# Patient Record
Sex: Female | Born: 2000 | Race: White | Hispanic: No | Marital: Single | Smoking: Never smoker
Health system: Southern US, Community
[De-identification: ages and names within clinical notes are randomized; demographics above are authoritative.]

## PROBLEM LIST (undated history)

## (undated) DIAGNOSIS — F32A Depression, unspecified: Secondary | ICD-10-CM

## (undated) HISTORY — DX: Depression, unspecified: F32.A

---
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
## 2021-12-12 ENCOUNTER — Ambulatory Visit
Admission: RE | Admit: 2021-12-12 | Discharge: 2021-12-12 | Disposition: A | Discharge status: Home / Self Care | Payer: BC Managed Care – PPO | Source: Ambulatory Visit | Attending: Urgent Care | Admitting: Urgent Care

## 2021-12-12 VITALS — BP 109/76 | HR 70 | Temp 98.9°F | Resp 15

## 2021-12-12 DIAGNOSIS — Z20822 Contact with and (suspected) exposure to covid-19: Secondary | ICD-10-CM | POA: Insufficient documentation

## 2021-12-12 DIAGNOSIS — J029 Acute pharyngitis, unspecified: Secondary | ICD-10-CM | POA: Insufficient documentation

## 2021-12-12 DIAGNOSIS — J019 Acute sinusitis, unspecified: Secondary | ICD-10-CM | POA: Insufficient documentation

## 2021-12-12 LAB — RESP PANEL BY RT-PCR (RSV, FLU A&B, COVID)  RVPGX2
Influenza A by PCR: NEGATIVE
Influenza B by PCR: NEGATIVE
Resp Syncytial Virus by PCR: POSITIVE — AB
SARS Coronavirus 2 by RT PCR: NEGATIVE

## 2021-12-12 LAB — POCT RAPID STREP A (OFFICE): Rapid Strep A Screen: NEGATIVE

## 2021-12-12 MED ORDER — METHYLPREDNISOLONE 4 MG PO TBPK: ORAL_TABLET | ORAL | 0 refills | Status: DC

## 2021-12-12 MED ORDER — PSEUDOEPHEDRINE HCL 60 MG PO TABS: 60.0000 mg | ORAL_TABLET | ORAL | 0 refills | Status: AC | PRN

## 2021-12-12 NOTE — Discharge Instructions (Addendum)
As discussed, start Medrol dose today as directed on packaging.  Continue until complete if tolerated without significant side effects.  Stop taking this medication if you develop significant anxiety/panic attacks.  I have also prescribed pseudoephedrine for congestion if you are unable to continue the steroid.     Follow up here or with your primary care provider if your symptoms are worsening or not improving with treatment.

## 2021-12-12 NOTE — ED Triage Notes (Signed)
Pt. States since Thursday she has been experiencing increasing fatigue, nasal congestion and a sore throat. Pt. Also mentions having an ocular migraine that was new for her  that affected her vision.

## 2021-12-12 NOTE — ED Provider Notes (Signed)
[facility]    CSN: [id number] Arrival date & time: 12/12/21  1116      History   Chief Complaint Chief Complaint  Patient presents with   Nasal Congestion    I have a cold that started on Friday, it has only gotten worse. I've been extremely fatigued, and had an ocular migraine, which I've never had before. I am also going to the mountains this weekend, and am scared of getting altitude sickness/worse. - Entered by patient   Sore Throat    HPI Mahathi Pokorney is a 21 y.o. female.    Sore Throat    Presents to urgent care with symptoms since last Thursday (6 days) she endorses fatigue, nasal congestion, sore throat.  Endorses "ocular migraine" and sinus pain, ears popping.  States the headache is affecting her vision.  Denies fever, myalgias, chills.  History reviewed. No pertinent past medical history.  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 No pertinent surgical history.  OB History   No obstetric history on file.      Home Medications    Prior to Admission medications   Medication Sig Start Date End Date Taking? Authorizing Provider  methylPREDNISolone (MEDROL DOSEPAK) 4 MG TBPK tablet Steroid taper. Take as directed by packaging. 12/12/21  Yes Arlander Gillen, Jeannett Senior, FNP  Misc Natural Products (JOINT SUPPORT COMPLEX PO) 10 mg by oral route. 01/26/18  Yes [provider]  norethindrone-ethinyl estradiol-iron (JUNEL FE 1.5/30) 1.5-30 MG-MCG tablet Take 1 tablet by mouth daily. 10/27/19  Yes [provider]  pseudoephedrine (SUDAFED) 60 MG tablet Take 1 tablet (60 mg total) by mouth every 4 (four) hours as needed for up to 14 days for congestion. 12/12/21 12/26/21 Yes Demeshia Sherburne, Jeannett Senior, FNP  sertraline (ZOLOFT) 25 MG  tablet Take 1 tablet by mouth daily. 09/20/21  Yes [provider]  amoxicillin (AMOXIL) 500 MG tablet TAKE 1 TABLET BY MOUTH EVERY 8 HOURS FOR 1 WEEK    [provider]  ascorbic acid (VITAMIN C) 250 MG tablet Take by mouth.    [provider]  Cholecalciferol (VITAMIN D3) 125 MCG/0.5ML LIQD Take by mouth.    [provider]  clindamycin (CLEOCIN) 2 % vaginal cream Place 1 Applicatorful vaginally at bedtime. 10/01/21   [provider]  cyanocobalamin (VITAMIN B12) 1000 MCG tablet Take by mouth.    [provider]  dicyclomine (BENTYL) 20 MG tablet TAKE 1 TABLET BY MOUTH 4 TIMES A DAY AS NEEDED FOR CRAMPING    [provider]  Fluoxetine HCl, PMDD, 10 MG TABS Take 1 tablet by mouth daily.    [provider]  HYDROcodone-acetaminophen (NORCO/VICODIN) 5-325 MG tablet Take 1 tablet by mouth every 6 (six) hours as needed.    [provider]  hydrOXYzine (ATARAX) 10 MG tablet Take 1 tablet by mouth 2 (two) times daily.    [provider]  ketoconazole (NIZORAL) 2 % shampoo Apply topically. 10/09/21   [provider]  lisdexamfetamine (VYVANSE) 30 MG capsule     [provider]  metoprolol tartrate (LOPRESSOR) 25 MG tablet TAKE 0.5 TABLETS (12.5 MG TOTAL) BY MOUTH 2 (TWO) TIMES DAILY AS NEEDED (FOR PALPITATIONS)    [provider]  metroNIDAZOLE (FLAGYL) 500 MG tablet Take 1 tablet by mouth  2 (two) times daily.    [provider]  nitrofurantoin, macrocrystal-monohydrate, (MACROBID) 100 MG capsule     [provider]  olopatadine (PATANOL) 0.1 % ophthalmic solution Place 1 drop into both eyes 2 (two) times daily.    [provider]  ondansetron (ZOFRAN) 4 MG tablet TAKE 1 TABLET BY MOUTH EVERY 8 HOURS AS NEEDED FOR NAUSEA/VOMITING.    [provider]  oxyCODONE-acetaminophen (PERCOCET/ROXICET) 5-325 MG tablet Take 1 tablet every 6 hours by oral route.     [provider]  spironolactone (ALDACTONE) 100 MG tablet Take 100 mg by mouth every morning. 10/11/21   [provider]  sulfamethoxazole-trimethoprim (BACTRIM DS) 800-160 MG tablet     [provider]    Family History History reviewed. No pertinent family history.  Social History Social History   Tobacco Use   Smoking status: Never   Smokeless tobacco: Never  Vaping Use   Vaping Use: Some days  Substance Use Topics   Alcohol use: Yes    Alcohol/week: 6.0 standard drinks of alcohol    Types: 6 Standard drinks or equivalent per week   Drug use: Never     Allergies   Patient has no known allergies.   Review of Systems Review of Systems   Physical Exam Triage Vital Signs ED Triage Vitals [12/12/21 1141]  Enc Vitals Group     BP 109/76     Pulse Rate 70     Resp 15     Temp 98.9 F (37.2 C)     Temp src      SpO2 98 %     Weight      Height      Head Circumference      Peak Flow      Pain Score 1     Pain Loc      Pain Edu?      Excl. in GC?    No data found.  Updated Vital Signs BP 109/76   Pulse 70   Temp 98.9 F (37.2 C)   Resp 15   LMP  (LMP Unknown)   SpO2 98%   Visual Acuity Right Eye Distance:   Left Eye Distance:   Bilateral Distance:    Right Eye Near:   Left Eye Near:    Bilateral Near:     Physical Exam Vitals reviewed.  Constitutional:      Appearance: She is well-developed.  HENT:     Head: Normocephalic and atraumatic.     Right Ear: Tympanic membrane normal.     Left Ear: Tympanic membrane normal.     Nose: Congestion present.     Mouth/Throat:     Mouth: Mucous membranes are moist.     Pharynx: Posterior oropharyngeal erythema present. No oropharyngeal exudate.  Cardiovascular:     Rate and Rhythm: Normal rate and regular rhythm.     Heart sounds: Normal heart sounds.  Pulmonary:     Effort: Pulmonary effort is normal.     Breath sounds: Normal breath sounds.  Neurological:     General:  No focal deficit present.     Mental Status: She is alert and oriented to person, place, and time.  Psychiatric:        Mood and Affect: Mood normal.        Behavior: Behavior normal.      UC Treatments / Results  Labs (all labs ordered are listed, but only abnormal results are displayed) Labs Reviewed  POCT RAPID STREP A (OFFICE)    EKG   Radiology No results found.  Procedures Procedures (including critical care time)  Medications Ordered in UC Medications - No data to display  Initial Impression / Assessment and Plan / UC Course  I have reviewed the triage vital signs and the nursing notes.  Pertinent labs & imaging results that were available during my care of the patient were reviewed by me and considered in my medical decision making (see chart for details).   Rapid strep is negative.  Viral versus allergic etiology for her symptoms is suspected.  Respiratory swab is obtained and pending.  Will prescribe Medrol taper for acute rhinosinusitis.  Patient has history of panic attacks with Medrol taper but tolerated more recently (now established on sertraline).  Instructed patient to stop this medication if adverse effects occur.  Prescribing phenyl ephedrine as nasal decongestant in the event she is unable to continue Medrol.   Final Clinical Impressions(s) / UC Diagnoses   Final diagnoses:  Sore throat  Acute rhinosinusitis     Discharge Instructions      As does gust, start Medrol dose today as directed on packaging.  Continue until complete if tolerated without significant side effects.  Stop taking this medication if you develop significant anxiety/panic attacks.  I have also prescribed pseudoephedrine for congestion if you are unable to continue the steroid.  Follow up here or with your primary care provider if your symptoms are worsening or not improving with treatment.   ED Prescriptions     Medication Sig Dispense Auth. Provider   methylPREDNISolone  (MEDROL DOSEPAK) 4 MG TBPK tablet Steroid taper. Take as directed by packaging. 21 tablet Massimo Hartland, FNP   pseudoephedrine (SUDAFED) 60 MG tablet Take 1 tablet (60 mg total) by mouth every 4 (four) hours as needed for up to 14 days for congestion. 30 tablet Johnel Yielding, FNP      PDMP not reviewed this encounter.   Rose Phi, [place] 12/12/21 1220

## 2022-02-04 IMAGING — CR DG CHEST 2V
2 series · 2 of 2 positions shown · non-contrast
Comparison: None.

CLINICAL DATA: Chest pain.  Dizziness.

EXAM:
CHEST - 2 VIEW

[chest pa]
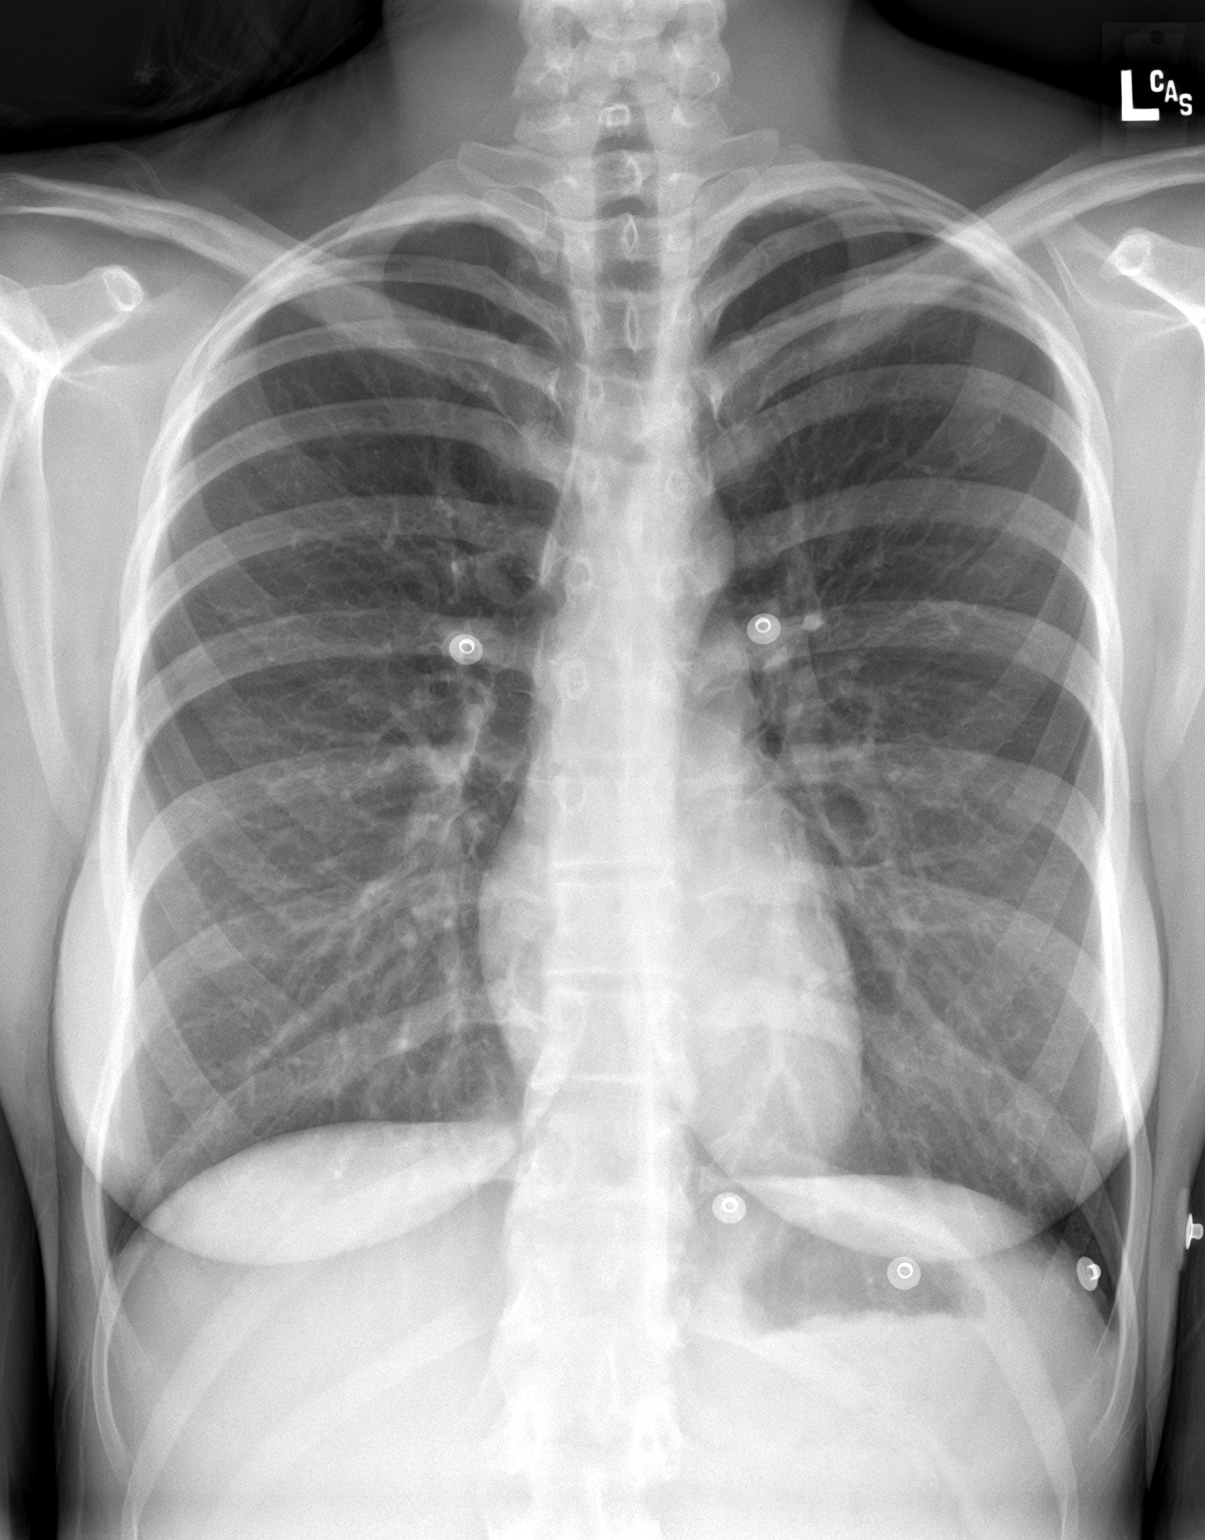

[chest lat]
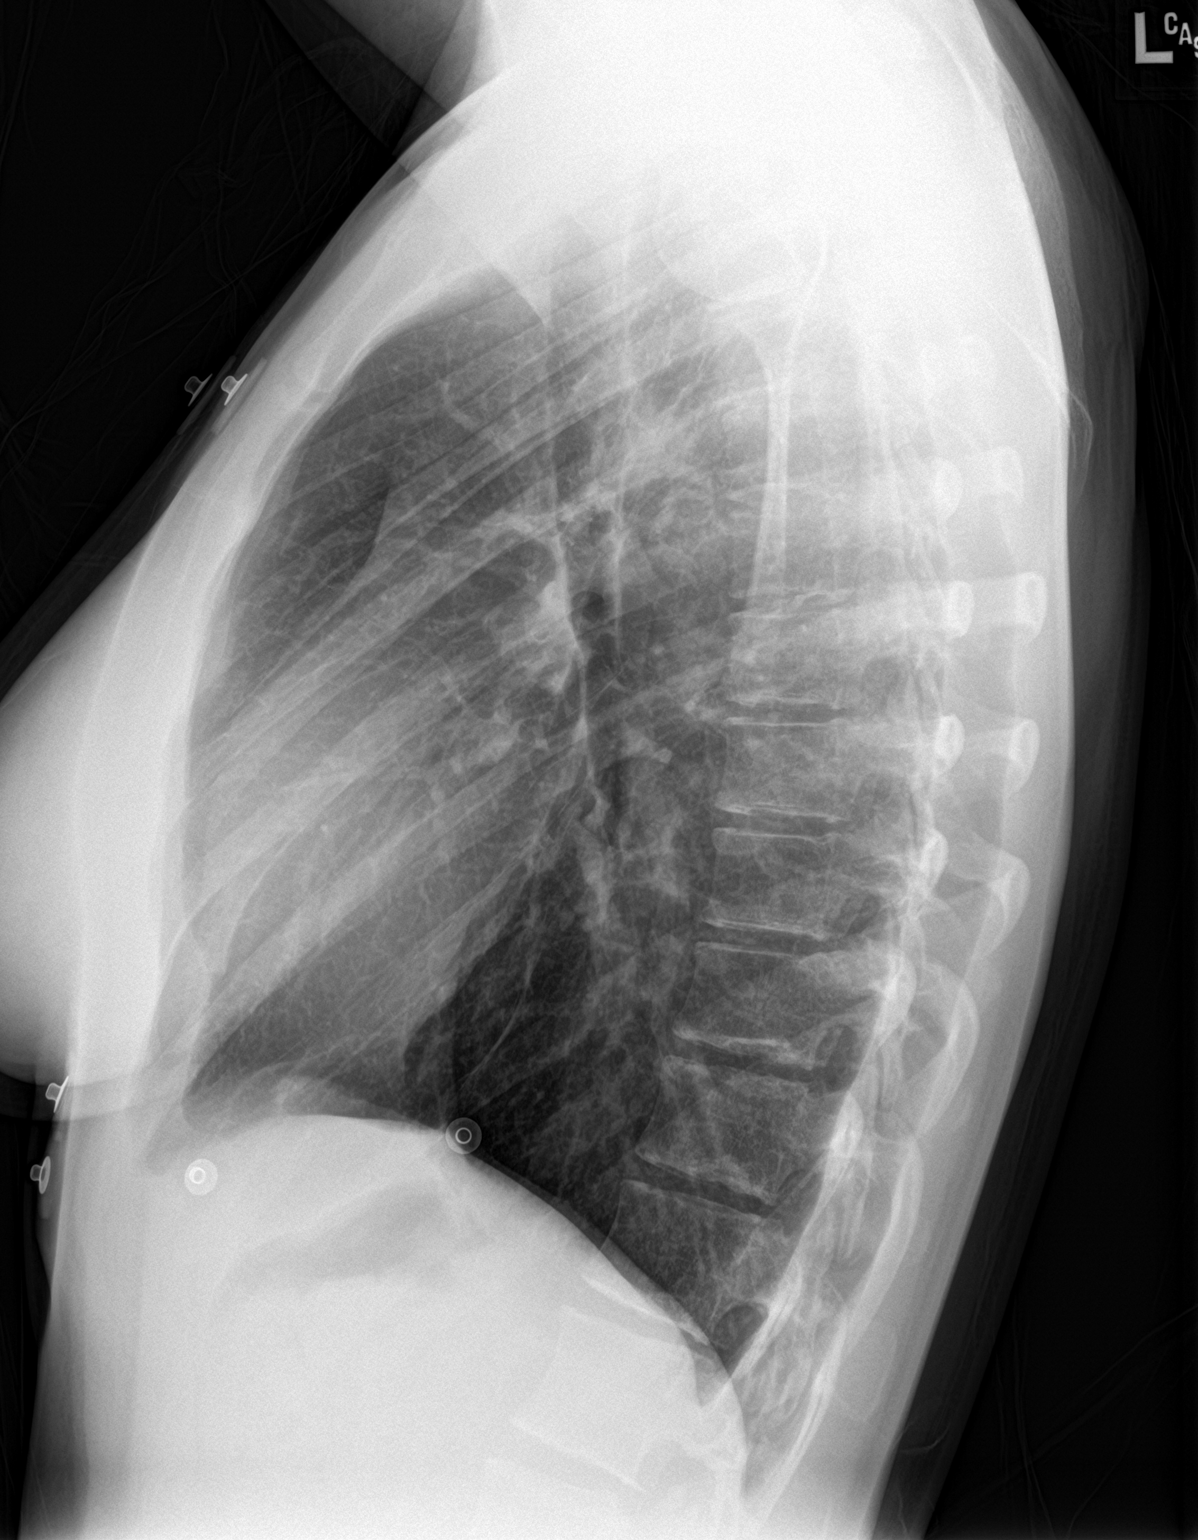

[2 of 2 positions shown; findings below may reference images not displayed]

FINDINGS: The heart size and mediastinal contours are within normal limits.
Both lungs are clear. The visualized skeletal structures are
unremarkable.
IMPRESSION: No active cardiopulmonary disease.

## 2022-12-02 ENCOUNTER — Other Ambulatory Visit: Payer: Self-pay

## 2022-12-02 ENCOUNTER — Ambulatory Visit (INDEPENDENT_AMBULATORY_CARE_PROVIDER_SITE_OTHER): Payer: BLUE CROSS/BLUE SHIELD | Admitting: Medical

## 2022-12-02 ENCOUNTER — Encounter: Payer: Self-pay | Admitting: Medical

## 2022-12-02 VITALS — BP 113/78 | HR 87 | Temp 97.4°F | Ht 68.5 in | Wt 143.0 lb

## 2022-12-02 DIAGNOSIS — T63481A Toxic effect of venom of other arthropod, accidental (unintentional), initial encounter: Secondary | ICD-10-CM

## 2022-12-02 MED ORDER — TRIAMCINOLONE ACETONIDE 0.1 % EX CREA: 1.0000 | TOPICAL_CREAM | Freq: Two times a day (BID) | CUTANEOUS | 0 refills | Status: DC

## 2022-12-07 ENCOUNTER — Encounter: Payer: Self-pay | Admitting: Medical

## 2022-12-07 NOTE — Patient Instructions (Signed)
-  Continue Allegra and Benadryl as needed for itching next few days. -Apply Triamcinolone cream twice daily as needed for itching. Use sparingly. Do not use longer than needed to control itching. -Try applying a cold compress (i.e. cold/wet washcloth or ice wrapped in a towel) to areas that are itchy/swollen. -Send MyChart message to provider or schedule return visit as needed for new/worsening symptoms or if symptoms do not improve as discussed with recommended treatment over next 3-5 days.

## 2022-12-09 ENCOUNTER — Ambulatory Visit (INDEPENDENT_AMBULATORY_CARE_PROVIDER_SITE_OTHER): Payer: BLUE CROSS/BLUE SHIELD | Admitting: Adult Health

## 2022-12-09 ENCOUNTER — Encounter: Payer: Self-pay | Admitting: Adult Health

## 2022-12-09 VITALS — BP 126/72 | HR 106 | Temp 98.7°F

## 2022-12-09 DIAGNOSIS — J029 Acute pharyngitis, unspecified: Secondary | ICD-10-CM | POA: Diagnosis not present

## 2022-12-09 NOTE — Progress Notes (Signed)
[facility] [address] [place], [place] [address] Phone: [phone] Fax: [phone]   Office Visit Note  Patient Name: Madison Murphy  Date of ONGEX:[id number]  Med Rec number [id number]  Date of Service: 12/09/2022  Patient has no known allergies.  Chief Complaint  Patient presents with   Acute Visit     HPI  Patient reports she woke up yesterday with headache and sore throat.  Today its worse and she has fatigue.   She also has some sinus pressure, and ear pain. Denies fever, chills or cough.  Friends sick currently.  She has been taking Dayquil, which she started last night.  No history of mono.  Had covid 2 weeks ago.   Current Medication:  Outpatient Encounter Medications as of 12/09/2022  Medication Sig   MAGNESIUM PO Take by mouth.   norethindrone-ethinyl estradiol-iron (HAILEY FE 1.5/30) 1.5-30 MG-MCG tablet    sertraline (ZOLOFT) 25 MG tablet Take 1 tablet by mouth daily.   spironolactone (ALDACTONE) 100 MG tablet Take 100 mg by mouth every morning.   triamcinolone cream (KENALOG) 0.1 % Apply 1 Application topically 2 (two) times daily. Do not use longer than 2 weeks.   No facility-administered encounter medications on file as of 12/09/2022.      Medical History: Past Medical History:  Diagnosis Date   Anxiety and depression      Vital Signs: BP 126/72   Pulse (!) 106   Temp 98.7 F (37.1 C) (Tympanic)   SpO2 98%    Review of Systems  Constitutional:  Positive for fatigue. Negative for chills and fever.  HENT:  Positive for sore throat.   Respiratory:  Negative for cough.   Neurological:  Positive for headaches.    Physical Exam Vitals and nursing note reviewed.  Constitutional:      Appearance: Normal appearance.  HENT:     Head: Normocephalic.     Right Ear: Tympanic membrane and ear canal normal.     Left Ear: Tympanic membrane and ear canal normal.     Nose: Nose normal.     Mouth/Throat:     Mouth: Mucous membranes are  moist.  Eyes:     Pupils: Pupils are equal, round, and reactive to light.  Pulmonary:     Effort: Pulmonary effort is normal.     Breath sounds: Normal breath sounds.  Lymphadenopathy:     Cervical: No cervical adenopathy.  Neurological:     Mental Status: She is alert.    Assessment/Plan: 1. Sore throat Evaluate labs when available in mychart. Appears viral at this time.  - EPSTEIN-BARR VIRUS (EBV) Antibody Profile - CBC w/Diff     General Counseling: Sarann verbalizes understanding of the findings of todays visit and agrees with plan of treatment. I have discussed any further diagnostic evaluation that may be needed or ordered today. We also reviewed her medications today. she has been encouraged to call the office with any questions or concerns that should arise related to todays visit.   Orders Placed This Encounter  Procedures   EPSTEIN-BARR VIRUS (EBV) Antibody Profile   CBC w/Diff    No orders of the defined types were placed in this encounter.   Time spent:20 Minutes Time spent includes review of chart, medications, test results, and follow up plan with the patient.    Johnna Acosta AGNP-C Nurse Practitioner

## 2022-12-10 ENCOUNTER — Encounter: Payer: Self-pay | Admitting: Adult Health

## 2022-12-10 LAB — EPSTEIN-BARR VIRUS (EBV) ANTIBODY PROFILE
EBV NA IgG: <18 U/mL (ref 0.0–17.9)
EBV VCA IgG: 521 U/mL — ABNORMAL HIGH (ref 0.0–17.9)
EBV VCA IgM: <36 U/mL (ref 0.0–35.9)

## 2022-12-10 LAB — CBC WITH DIFFERENTIAL/PLATELET
Basophils Absolute: 0.1 10*3/uL (ref 0.0–0.2)
Basos: 1 %
EOS (ABSOLUTE): 0.1 10*3/uL (ref 0.0–0.4)
Eos: 1 %
Hematocrit: 45.9 % (ref 34.0–46.6)
Hemoglobin: 14.8 g/dL (ref 11.1–15.9)
Immature Grans (Abs): 0 10*3/uL (ref 0.0–0.1)
Immature Granulocytes: 0 %
Lymphocytes Absolute: 1.4 10*3/uL (ref 0.7–3.1)
Lymphs: 16 %
MCH: 30 pg (ref 26.6–33.0)
MCHC: 32.2 g/dL (ref 31.5–35.7)
MCV: 93 fL (ref 79–97)
Monocytes Absolute: 0.6 10*3/uL (ref 0.1–0.9)
Monocytes: 7 %
Neutrophils Absolute: 6.5 10*3/uL (ref 1.4–7.0)
Neutrophils: 75 %
Platelets: 201 10*3/uL (ref 150–450)
RBC: 4.93 x10E6/uL (ref 3.77–5.28)
RDW: 11.7 % (ref 11.7–15.4)
WBC: 8.6 10*3/uL (ref 3.4–10.8)

## 2022-12-11 ENCOUNTER — Encounter: Payer: Self-pay | Admitting: Adult Health

## 2022-12-14 ENCOUNTER — Other Ambulatory Visit: Payer: Self-pay | Admitting: Physician Assistant

## 2022-12-14 DIAGNOSIS — J029 Acute pharyngitis, unspecified: Secondary | ICD-10-CM

## 2022-12-14 MED ORDER — PREDNISONE 10 MG PO TABS: ORAL_TABLET | ORAL | 0 refills | Status: DC

## 2022-12-14 NOTE — Progress Notes (Signed)
Prednisone taper prescribed.

## 2023-07-29 ENCOUNTER — Other Ambulatory Visit: Payer: Self-pay

## 2023-07-29 ENCOUNTER — Encounter: Payer: Self-pay | Admitting: Physician Assistant

## 2023-07-29 ENCOUNTER — Ambulatory Visit (INDEPENDENT_AMBULATORY_CARE_PROVIDER_SITE_OTHER): Admitting: Physician Assistant

## 2023-07-29 VITALS — BP 92/63 | HR 71 | Temp 96.7°F | Wt 150.0 lb

## 2023-07-29 DIAGNOSIS — J069 Acute upper respiratory infection, unspecified: Secondary | ICD-10-CM

## 2023-07-29 MED ORDER — AZITHROMYCIN 250 MG PO TABS: ORAL_TABLET | ORAL | 0 refills | Status: AC

## 2023-07-29 MED ORDER — PSEUDOEPH-BROMPHEN-DM 30-2-10 MG/5ML PO SYRP: 10.0000 mL | ORAL_SOLUTION | ORAL | 0 refills | Status: AC | PRN

## 2023-07-29 NOTE — Progress Notes (Signed)
 [facility] [address] [place], [place] [address] Phone: [phone] Fax: [phone]   Office Visit Note  Patient Name: Madison Murphy  Date of ZHYQM:[id number]  Med Rec number [id number]  Date of Service: 07/29/2023  Patient has no known allergies.  Chief Complaint  Patient presents with   Acute Visit     HPI  Patient reports developing cold symptoms starting Thursday, which worsened by Sunday. Since then, they have experienced a progressively worsening sore throat, dry cough throughout the day, and fatigue. They also note intermittent headaches and congestion with green nasal discharge. Voice has been lost, and symptoms have persisted despite taking [employer]-brand DayQuil starting yesterday. No history of asthma. Patient is graduating soon and has family arriving on Thursday, so is hoping for symptom relief before then.    Current Medication:  Outpatient Encounter Medications as of 07/29/2023  Medication Sig   MAGNESIUM PO Take by mouth.   Norethin Ace-Eth Estrad-FE (JUNEL FE 24 PO) Take 1 tablet by mouth daily.   sertraline (ZOLOFT) 25 MG tablet Take 1 tablet by mouth daily.   [DISCONTINUED] norethindrone-ethinyl estradiol-iron (HAILEY FE 1.5/30) 1.5-30 MG-MCG tablet    [DISCONTINUED] predniSONE  (DELTASONE ) 10 MG tablet Take six tablets on day 1, five tablets on day 2, four tablets on day 3, three tablets on day 4, two tablets on day 5, and one tablet on day 6.   [DISCONTINUED] spironolactone (ALDACTONE) 100 MG tablet Take 100 mg by mouth every morning.   [DISCONTINUED] triamcinolone  cream (KENALOG ) 0.1 % Apply 1 Application topically 2 (two) times daily. Do not use longer than 2 weeks.   No facility-administered encounter medications on file as of 07/29/2023.      Medical History: Past Medical History:  Diagnosis Date   Anxiety and depression      Vital Signs: BP 92/63   Pulse 71   Temp (!) 96.7 F (35.9 C) (Tympanic)   Wt 150 lb (68 kg)   SpO2 98%    BMI 22.47 kg/m    Review of Systems  Physical Exam Constitutional:      Appearance: Normal appearance.  HENT:     Head: Normocephalic and atraumatic.     Right Ear: Tympanic membrane normal.     Left Ear: Tympanic membrane normal.     Mouth/Throat:     Mouth: Mucous membranes are moist.     Pharynx: Oropharynx is clear.  Eyes:     Conjunctiva/sclera: Conjunctivae normal.  Cardiovascular:     Rate and Rhythm: Normal rate and regular rhythm.     Pulses: Normal pulses.     Heart sounds: Normal heart sounds.  Pulmonary:     Effort: Pulmonary effort is normal.     Breath sounds: Normal breath sounds.  Musculoskeletal:     Cervical back: Normal range of motion and neck supple.  Neurological:     Mental Status: She is alert.       Assessment/Plan: Exam is reassuring and presentation is most consistent with a viral upper respiratory infection, which can have a prolonged course. Will start Bromfed for symptomatic relief. A Z-Pak was also prescribed in case of a possible bacterial etiology, as symptoms have been present for 7 days. Patient was advised to fill the antibiotic only if symptoms persist or worsen over the next 48-72 hours. Precautions and return instructions reviewed. No known allergies to antibiotics.   General Counseling: Madison Murphy verbalizes understanding of the findings of todays visit and agrees with plan of treatment. I have  discussed any further diagnostic evaluation that may be needed or ordered today. We also reviewed her medications today. she has been encouraged to call the office with any questions or concerns that should arise related to todays visit.   No orders of the defined types were placed in this encounter.   No orders of the defined types were placed in this encounter.   Time spent:30 Minutes Time spent includes review of chart, medications, test results, and follow up plan with the patient.    Anita Barman, PA-C [facility]
